# Patient Record
Sex: Male | Born: 1985 | Race: Black or African American | Hispanic: No | Marital: Single | Smoking: Never smoker
Health system: Southern US, Community
[De-identification: ages and names within clinical notes are randomized; demographics above are authoritative.]

---

## 2007-06-05 ENCOUNTER — Emergency Department (HOSPITAL_COMMUNITY): Admission: EM | Admit: 2007-06-05 | Discharge: 2007-06-05 | Payer: Self-pay | Admitting: Emergency Medicine

## 2008-04-02 ENCOUNTER — Emergency Department (HOSPITAL_COMMUNITY): Admission: EM | Admit: 2008-04-02 | Discharge: 2008-04-02 | Payer: Self-pay | Admitting: Emergency Medicine

## 2008-05-29 ENCOUNTER — Emergency Department (HOSPITAL_COMMUNITY): Admission: EM | Admit: 2008-05-29 | Discharge: 2008-05-29 | Payer: Self-pay | Admitting: Family Medicine

## 2008-08-11 IMAGING — CR DG HAND COMPLETE 3+V*R*
3 series · 3 of 3 positions shown · non-contrast
Comparison: none

CLINICAL DATA: Fall, thumb pain. 
 RIGHT HAND ? 3 VIEW:

[view not recorded (1 of 3)]
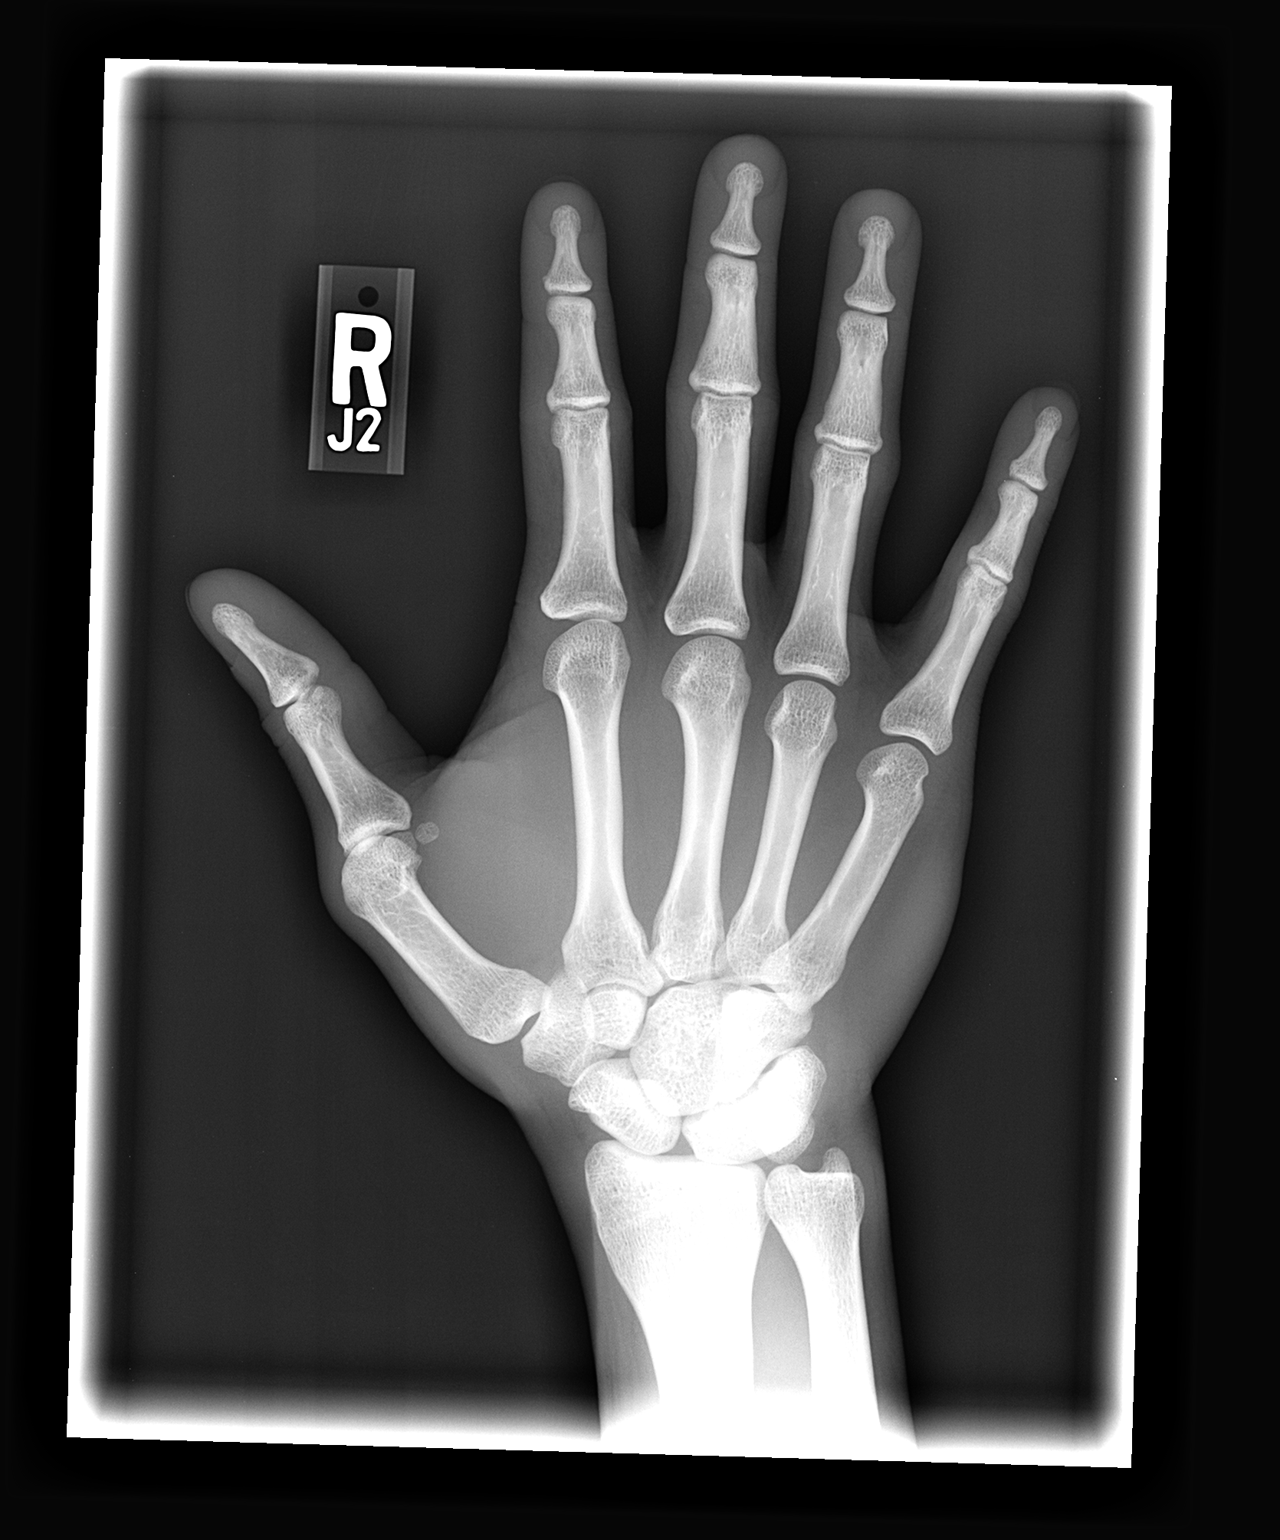

[view not recorded (2 of 3)]
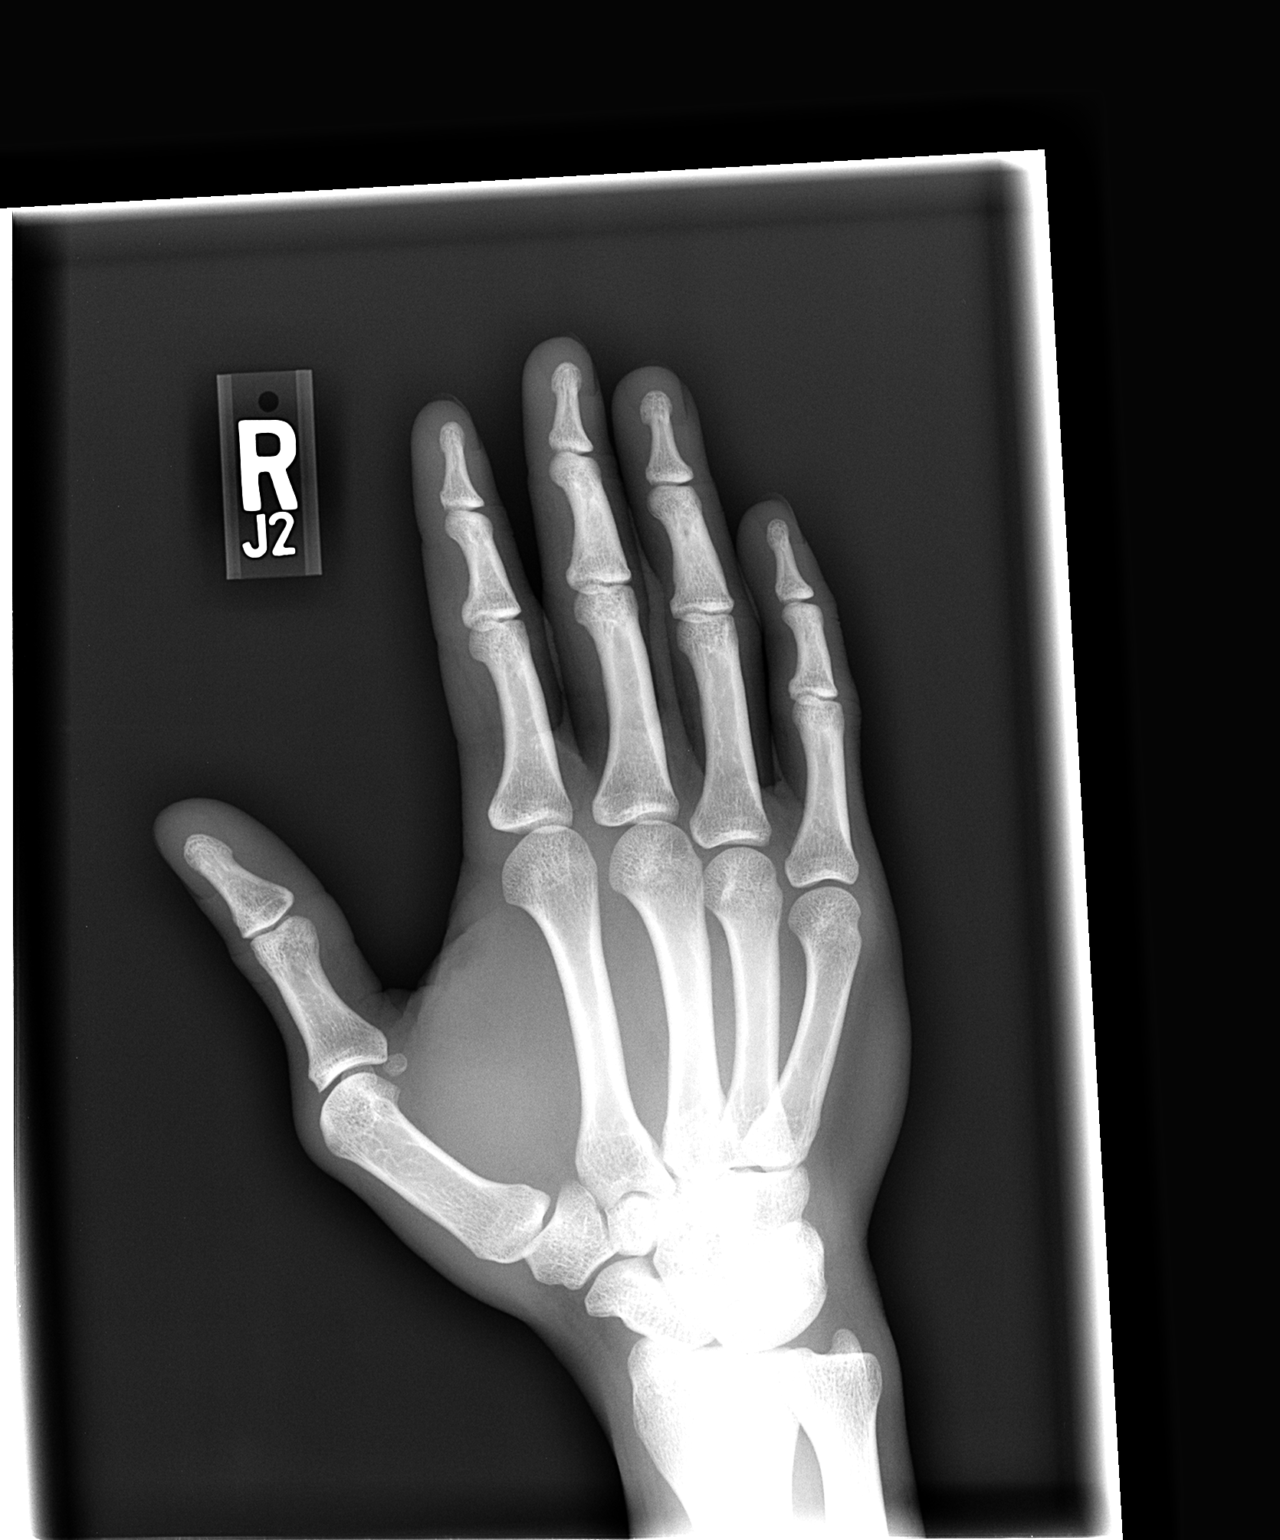

[view not recorded (3 of 3)]
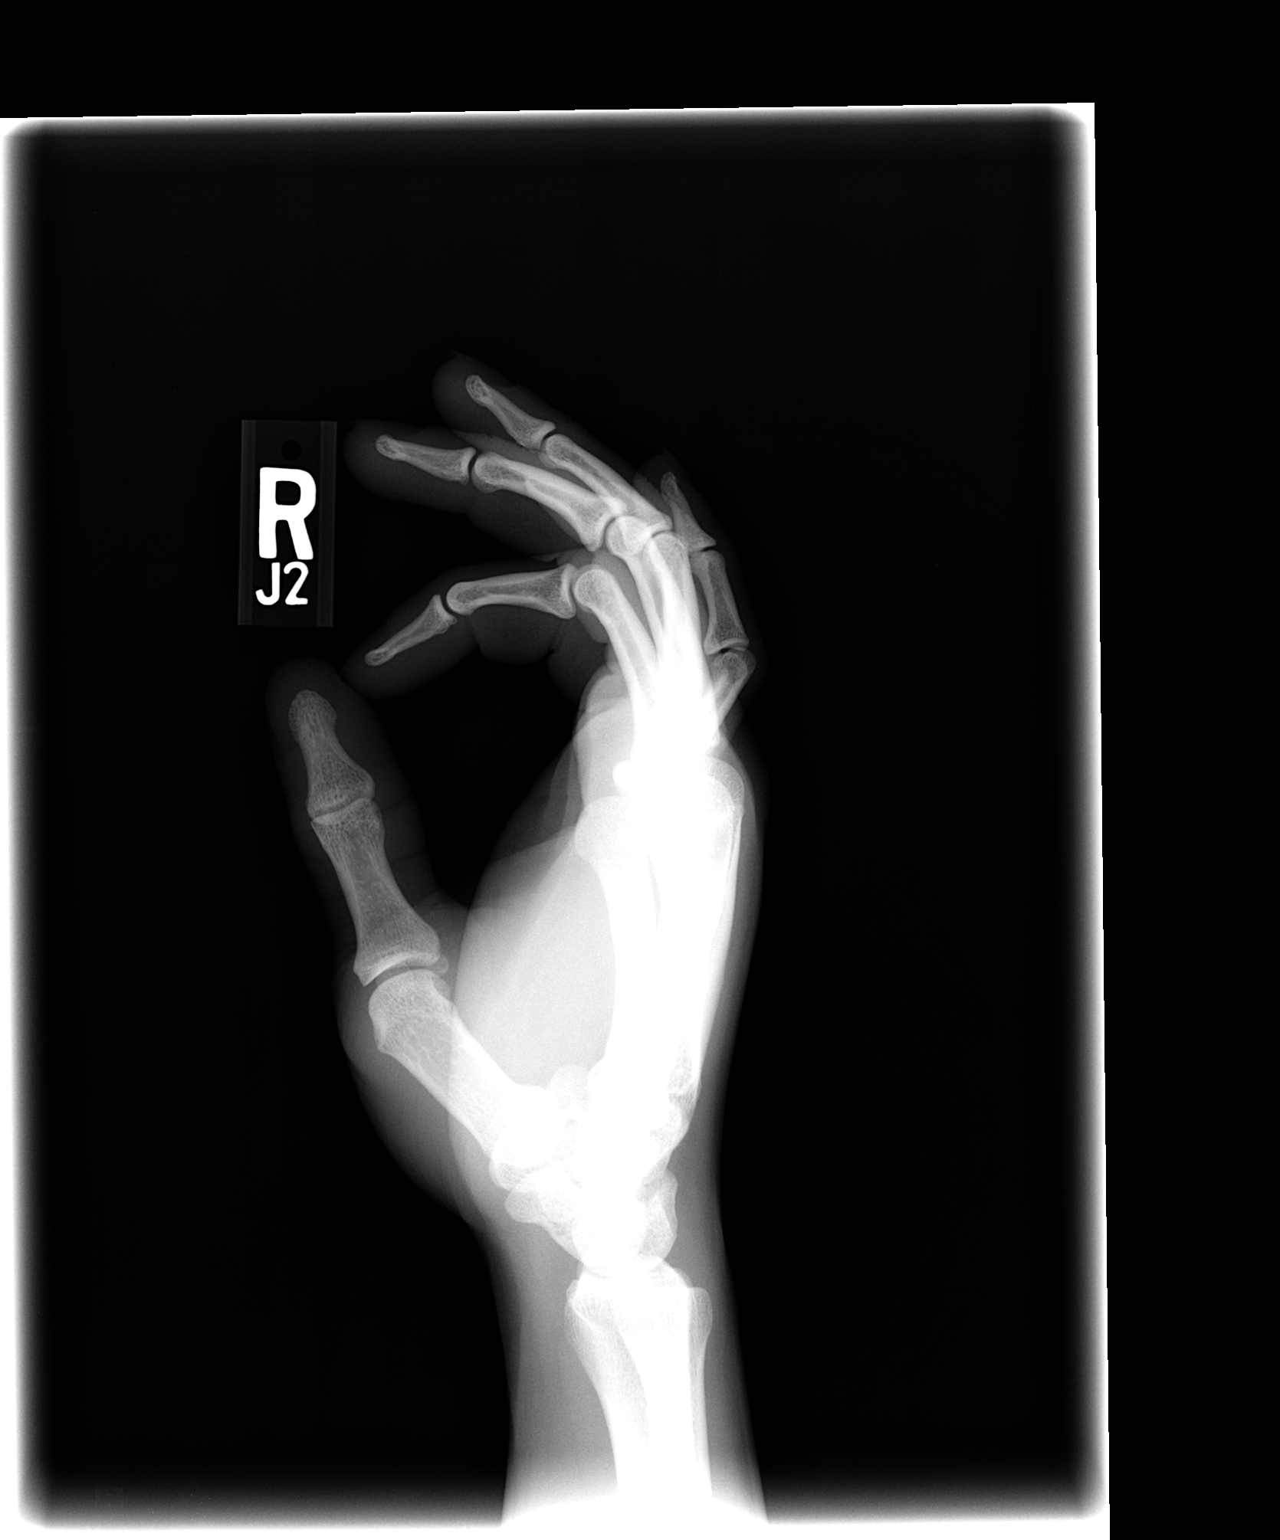

[3 of 3 positions shown; findings below may reference images not displayed]

FINDINGS: There is mild degenerative disease at the metacarpal phalangeal joint of the thumb.  There may be mild subluxation at this joint but I do not see a fracture.  The remainder of the examination is normal.
IMPRESSION: As discussed above.

## 2011-08-27 ENCOUNTER — Emergency Department (HOSPITAL_COMMUNITY)
Admission: EM | Admit: 2011-08-27 | Discharge: 2011-08-27 | Disposition: A | Discharge status: Home / Self Care | Payer: Self-pay | Source: Home / Self Care | Attending: Family Medicine | Admitting: Family Medicine

## 2011-08-27 ENCOUNTER — Encounter (HOSPITAL_COMMUNITY): Payer: Self-pay | Admitting: *Deleted

## 2011-08-27 DIAGNOSIS — J069 Acute upper respiratory infection, unspecified: Secondary | ICD-10-CM

## 2011-08-27 MED ORDER — AZITHROMYCIN 250 MG PO TABS: ORAL_TABLET | ORAL | Status: AC

## 2011-08-27 NOTE — ED Notes (Signed)
Has cough, headache, sneezing for past 3 weeks, cough not getting better.  Also wants to make sure bump on upper lip is a cold sore.

## 2011-08-27 NOTE — ED Provider Notes (Signed)
History     CSN: [id number]  Arrival date & time 08/27/11  1044   First MD Initiated Contact with Patient 08/27/11 1114      Chief Complaint  Patient presents with  . Cough    (Consider location/radiation/quality/duration/timing/severity/associated sxs/prior treatment) Patient is a 26 y.o. male presenting with cough. The history is provided by the patient.  Cough This is a new problem. The current episode started more than 1 week ago. The problem occurs constantly. The problem has not changed since onset.The cough is non-productive. There has been no fever. Associated symptoms include rhinorrhea. He is not a smoker.    History reviewed. No pertinent past medical history.  History reviewed. No pertinent past surgical history.  No family history on file.  History  Substance Use Topics  . Smoking status: Never Smoker   . Smokeless tobacco: Not on file  . Alcohol Use: Yes     occassional      Review of Systems  Constitutional: Negative.   HENT: Positive for congestion, rhinorrhea and postnasal drip.   Respiratory: Positive for cough.   Gastrointestinal: Negative.   Skin: Negative.     Allergies  Review of patient's allergies indicates no known allergies.  Home Medications  No current outpatient prescriptions on file.  BP 114/73  Pulse 64  Temp(Src) 98.5 F (36.9 C) (Oral)  Resp 20  SpO2 99%  Physical Exam  Nursing note and vitals reviewed. Constitutional: He appears well-developed and well-nourished.  HENT:  Head: Normocephalic.  Right Ear: External ear normal.  Left Ear: External ear normal.  Mouth/Throat: Oropharynx is clear and moist.  Eyes: Pupils are equal, round, and reactive to light.  Neck: Normal range of motion. Neck supple.  Cardiovascular: Normal rate, normal heart sounds and intact distal pulses.   Pulmonary/Chest: Effort normal and breath sounds normal.  Lymphadenopathy:    He has no cervical adenopathy.  Skin: Skin is warm and dry.     ED Course  Procedures (including critical care time)  Labs Reviewed - No data to display No results found.   No diagnosis found.    MDM          Barkley Bruns, MD 08/27/11 [phone]

## 2019-04-18 ENCOUNTER — Telehealth: Payer: Self-pay

## 2019-04-18 NOTE — Telephone Encounter (Signed)
Opened in error
